# Patient Record
Sex: Female | Born: 1995 | Race: White | Hispanic: No | Marital: Single | Smoking: Never smoker
Health system: Southern US, Community
[De-identification: ages and names within clinical notes are randomized; demographics above are authoritative.]

## PROBLEM LIST (undated history)

## (undated) DIAGNOSIS — J45909 Unspecified asthma, uncomplicated: Secondary | ICD-10-CM

## (undated) DIAGNOSIS — K5900 Constipation, unspecified: Secondary | ICD-10-CM

## (undated) DIAGNOSIS — R109 Unspecified abdominal pain: Secondary | ICD-10-CM

## (undated) HISTORY — PX: CLOSED REDUCTION WRIST FRACTURE: SHX1091

## (undated) HISTORY — DX: Unspecified abdominal pain: R10.9

## (undated) HISTORY — DX: Constipation, unspecified: K59.00

---
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
## 2017-11-22 ENCOUNTER — Other Ambulatory Visit: Payer: Self-pay | Admitting: Obstetrics & Gynecology

## 2018-02-22 ENCOUNTER — Ambulatory Visit (INDEPENDENT_AMBULATORY_CARE_PROVIDER_SITE_OTHER): Payer: 59 | Admitting: Advanced Practice Midwife

## 2018-02-22 ENCOUNTER — Encounter: Payer: Self-pay | Admitting: Advanced Practice Midwife

## 2018-02-22 ENCOUNTER — Other Ambulatory Visit: Payer: Self-pay

## 2018-02-22 VITALS — BP 114/76 | HR 68 | Ht 61.0 in | Wt 104.0 lb

## 2018-02-22 DIAGNOSIS — B373 Candidiasis of vulva and vagina: Secondary | ICD-10-CM | POA: Diagnosis not present

## 2018-02-22 DIAGNOSIS — D242 Benign neoplasm of left breast: Secondary | ICD-10-CM

## 2018-02-22 DIAGNOSIS — B3731 Acute candidiasis of vulva and vagina: Secondary | ICD-10-CM

## 2018-02-22 MED ORDER — FLUCONAZOLE 150 MG PO TABS: ORAL_TABLET | ORAL | 2 refills | Status: DC

## 2018-02-22 NOTE — Progress Notes (Signed)
[facility] Visit  Patient name: Christine Solomon MRN [id number]  Date of birth: 03-19-1996  CC & HPI:  Christine Solomon is a 22 y.o.  female presenting today for vaginal itch/pian w/ sex for a few days and left breast mass for a few months  Tender only when touched. 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 Abdominal pain   . Asthma   . Constipation    Past Surgical History:  Procedure Laterality Date  . BREAST BIOPSY Left 06/30/2016   Procedure: BREAST BIOPSY;  Surgeon: Christine Signs, MD;  Location: [facility];  Service: General;  Laterality: Left;  . CLOSED REDUCTION WRIST FRACTURE Left    Family History  Problem Relation Age of Onset  . Cholelithiasis Mother   . Irritable bowel syndrome Father   . Breast cancer Paternal Grandmother   . Cancer Maternal Grandmother        skin  . Hirschsprung's disease Neg Hx   . Celiac disease Neg Hx     Current Outpatient Medications:  .  cetirizine (ZYRTEC) 10 MG tablet, Take 10 mg by mouth daily., Disp: , Rfl:  .  ibuprofen (ADVIL,MOTRIN) 200 MG tablet, Take 400 mg by mouth every 8 (eight) hours as needed (for pain.). , Disp: , Rfl:  .  montelukast (SINGULAIR) 10 MG tablet, Take 10 mg by mouth every evening. , Disp: , Rfl:  .  polyethylene glycol (MIRALAX / GLYCOLAX) packet, Take 17 g by mouth daily as needed (for constipation). , Disp: , Rfl:  .  SPRINTEC 28 0.25-35 MG-MCG tablet, TAKE 1 TABLET BY MOUTH EVERY DAY, Disp: 84 tablet, Rfl: 4 .  fluconazole (DIFLUCAN) 150 MG tablet, 1 po stat; repeat in 3 days, Disp: 2 tablet, Rfl: 2 Social History: Reviewed -  reports that she has never smoked. She has never used smokeless tobacco.  Review of Systems:   Constitutional: Negative for fever and chills Eyes: Negative for visual disturbances Respiratory: Negative for shortness of breath, dyspnea Cardiovascular: Negative  for chest pain or palpitations  Gastrointestinal: Negative for vomiting, diarrhea and constipation; no abdominal pain Genitourinary: Negative for dysuria and urgency, vaginal irritation or itching Musculoskeletal: Negative for back pain, joint pain, myalgias  Neurological: Negative for dizziness and headaches    Objective Findings:    Physical Examination: Vitals:   02/22/18 1441  BP: 114/76  Pulse: 68   General appearance - well appearing, and in no distress Mental status - alert, oriented to person, place, and time Chest:  Normal respiratory effort Breast:  2cm mobile, sl tender nodule. No erythema Heart - normal rate and regular rhythm Pelvic: red vulva/labia minora/majora w/skin breakdown in creases.  Vagina red w/yeast like DC.  Wet prep neg clue, trich or WBC, + yeast Musculoskeletal:  Normal range of motion without pain Extremities:  No edema    No results found for this or any previous visit (from the past 24 hour(s)).    Assessment & Plan:  A:   ? Fibroadenoma  yeast P:  Breast US 8/20 at 3:30 at [facility]  rx diflucan   Return for If you have any problems.  Christine Solomon CNM 02/22/2018 3:22 PM    '

## 2018-02-22 NOTE — Patient Instructions (Signed)
8/20 at 3:50 (arrive at 3:30) for breast ultrasound at [facility]

## 2018-03-07 ENCOUNTER — Other Ambulatory Visit (HOSPITAL_COMMUNITY): Payer: Self-pay

## 2019-09-05 ENCOUNTER — Encounter: Payer: Self-pay | Admitting: Advanced Practice Midwife

## 2019-09-05 ENCOUNTER — Other Ambulatory Visit: Payer: Self-pay

## 2019-09-05 ENCOUNTER — Ambulatory Visit (INDEPENDENT_AMBULATORY_CARE_PROVIDER_SITE_OTHER): Payer: 59 | Admitting: Advanced Practice Midwife

## 2019-09-05 ENCOUNTER — Other Ambulatory Visit (HOSPITAL_COMMUNITY)
Admission: RE | Admit: 2019-09-05 | Discharge: 2019-09-05 | Disposition: A | Discharge status: Home / Self Care | Payer: 59 | Source: Ambulatory Visit | Attending: Advanced Practice Midwife | Admitting: Advanced Practice Midwife

## 2019-09-05 VITALS — BP 109/68 | HR 72 | Ht 61.0 in | Wt 100.5 lb

## 2019-09-05 DIAGNOSIS — Z01419 Encounter for gynecological examination (general) (routine) without abnormal findings: Secondary | ICD-10-CM | POA: Insufficient documentation

## 2019-09-05 MED ORDER — ETONOGESTREL-ETHINYL ESTRADIOL 0.12-0.015 MG/24HR VA RING: VAGINAL_RING | VAGINAL | 4 refills | Status: AC

## 2019-09-05 NOTE — Patient Instructions (Signed)
Ethinyl Estradiol; Etonogestrel vaginal ring What is this medicine? ETHINYL ESTRADIOL; ETONOGESTREL (ETH in il es tra DYE ole; et oh noe JES trel) vaginal ring is a flexible, vaginal ring used as a contraceptive (birth control method). This medicine combines 2 types of female hormones, an estrogen and a progestin. This ring is used to prevent ovulation and pregnancy. Each ring is effective for 1 month. This medicine may be used for other purposes; ask your health care provider or pharmacist if you have questions. COMMON BRAND NAME(S): EluRyng, NuvaRing What should I tell my health care provider before I take this medicine? They need to know if you have any of these conditions:  abnormal vaginal bleeding  blood vessel disease or blood clots  breast, cervical, endometrial, ovarian, liver, or uterine cancer  diabetes  gallbladder disease  having surgery  heart disease or recent heart attack  high blood pressure  high cholesterol or triglycerides  history of irregular heartbeat or heart valve problems  kidney disease  liver disease  migraine headaches  protein C deficiency  protein S deficiency  recently had a baby, miscarriage, or abortion  stroke  systemic lupus erythematosus (SLE)  tobacco smoker  your age is more than 22 years old  an unusual or allergic reaction to estrogens, progestins, other medicines, foods, dyes, or preservatives  pregnant or trying to get pregnant  breast-feeding How should I use this medicine? Insert the ring into your vagina as directed. Follow the directions on the prescription label. The ring will remain place for 3 weeks and is then removed for a 1-week break. A new ring is inserted 1 week after the last ring was removed, on the same day of the week. Check often to make sure the ring is still in place. If the ring was out of the vagina for an unknown amount of time, you may not be protected from pregnancy. Perform a pregnancy test and  call your doctor. Do not use more often than directed. A patient package insert for the product will be given with each prescription and refill. Read this sheet carefully each time. The sheet may change frequently. Contact your pediatrician regarding the use of this medicine in children. Special care may be needed. Overdosage: If you think you have taken too much of this medicine contact a poison control center or emergency room at once. NOTE: This medicine is only for you. Do not share this medicine with others. What if I miss a dose? You will need to use the ring exactly as directed. It is very important to follow the schedule every cycle. If you do not use the ring as directed, you may not be protected from pregnancy. If the ring should slip out, is lost, or if you leave it in longer or shorter than you should, contact your health care professional for advice. What may interact with this medicine? Do not take this medicine with the following medications:  dasabuvir; ombitasvir; paritaprevir; ritonavir  ombitasvir; paritaprevir; ritonavir  vaginal lubricants or other vaginal products that are oil-based or silicone-based This medicine may also interact with the following medications:  acetaminophen  antibiotics or medicines for infections, especially rifampin, rifabutin, rifapentine, and griseofulvin, and possibly penicillins or tetracyclines  aprepitant or fosaprepitant  armodafinil  ascorbic acid (vitamin C)  barbiturate medicines, such as phenobarbital or primidone  bosentan  certain antiviral medicines for hepatitis, HIV or AIDS  certain medicines for cancer treatment  certain medicines for seizures like carbamazepine, clobazam, felbamate, lamotrigine, oxcarbazepine, phenytoin,   rufinamide, topiramate  certain medicines for treating high cholesterol  cyclosporine  dantrolene  elagolix  flibanserin  grapefruit juice  lesinurad  medicines for diabetes  medicines  to treat fungal infections, such as griseofulvin, miconazole, fluconazole, ketoconazole, itraconazole, posaconazole or voriconazole  mifepristone  mitotane  modafinil  morphine  mycophenolate  St. John's wort  tamoxifen  temazepam  theophylline or aminophylline  thyroid hormones  tizanidine  tranexamic acid  ulipristal  warfarin This list may not describe all possible interactions. Give your health care provider a list of all the medicines, herbs, non-prescription drugs, or dietary supplements you use. Also tell them if you smoke, drink alcohol, or use illegal drugs. Some items may interact with your medicine. What should I watch for while using this medicine? Visit your doctor or health care professional for regular checks on your progress. You will need a regular breast and pelvic exam and Pap smear while on this medicine. Check with your doctor or health care professional to see if you need an additional method of contraception during the first cycle that you use this ring. Female condoms (made with natural rubber latex, polyisoprene, and polyurethane) and spermicides may be used. Do not use a diaphragm, cervical cap, or a female condom, as the ring can interfere with these birth control methods and their proper placement. If you have any reason to think you are pregnant, stop using this medicine right away and contact your doctor or health care professional. If you are using this medicine for hormone related problems, it may take several cycles of use to see improvement in your condition. Smoking increases the risk of getting a blood clot or having a stroke while you are using hormonal birth control, especially if you are more than 22 years old. You are strongly advised not to smoke. Some women are prone to getting dark patches on the skin of the face (cholasma). Your risk of getting chloasma with this medicine is higher if you had chloasma during a pregnancy. Keep out of the  sun. If you cannot avoid being in the sun, wear protective clothing and use sunscreen. Do not use sun lamps or tanning beds/booths. This medicine can make your body retain fluid, making your fingers, hands, or ankles swell. Your blood pressure can go up. Contact your doctor or health care professional if you feel you are retaining fluid. If you are going to have elective surgery, you may need to stop using this medicine before the surgery. Consult your health care professional for advice. This medicine does not protect you against HIV infection (AIDS) or any other sexually transmitted diseases. What side effects may I notice from receiving this medicine? Side effects that you should report to your doctor or health care professional as soon as possible:  allergic reactions such as skin rash or itching, hives, swelling of the lips, mouth, tongue, or throat  depression  high blood pressure  migraines or severe, sudden headaches  signs and symptoms of a blood clot such as breathing problems; changes in vision; chest pain; severe, sudden headache; pain, swelling, warmth in the leg; trouble speaking; sudden numbness or weakness of the face, arm or leg  signs and symptoms of infection like fever or chills with dizziness and a sunburn-like rash, or pain or trouble passing urine  stomach pain  symptoms of vaginal infection like itching, irritation or unusual discharge  yellowing of the eyes or skin Side effects that usually do not require medical attention (report these to your doctor   or health care professional if they continue or are bothersome):  acne  breast pain, tenderness  irregular vaginal bleeding or spotting, particularly during the first month of use  mild headache  nausea  painful periods  vomiting This list may not describe all possible side effects. Call your doctor for medical advice about side effects. You may report side effects to FDA at 1-800-FDA-1088. Where should I  keep my medicine? Keep out of the reach of children. Store unopened medicine for up to 4 months at room temperature at 15 and 30 degrees C (59 and 86 degrees F). Protect from light. Do not store above 30 degrees C (86 degrees F). Throw away any unused medicine 4 months after the dispense date or the expiration date, whichever comes first. A ring may only be used for 1 cycle (1 month). After the 3-week cycle, a used ring is removed and should be placed in the re-closable foil pouch and discarded in the trash out of reach of children and pets. Do NOT flush down the toilet. NOTE: This sheet is a summary. It may not cover all possible information. If you have questions about this medicine, talk to your doctor, pharmacist, or health care provider.  2020 Elsevier/Gold Standard (2019-01-25 12:31:47)  

## 2019-09-05 NOTE — Progress Notes (Signed)
   WELL-WOMAN EXAMINATION Patient name: Christine Solomon MRN [id number]  Date of birth: March 18, 1996 Chief Complaint:   Gynecologic Exam  History of Present Illness:   Christine Solomon is a 24 y.o. G0P0000  female being seen today for a routine well-woman exam.  Current complaints: would like to start contraception- prefers NuvaRing; has used OCPs in the past with nothing recently; has irreg cycles due to low body weight, approx 1 cycle q 2-3 mos  PCP: none      does not desire labs Patient's last menstrual period was 08/31/2019 (exact date). The current method of family planning is NuvaRing vaginal inserts- wanting to start Last pap never. .  Last mammogram: has had breast u/s and lumpectomy due to fibroademoas- benign Last colonoscopy: never.  Review of Systems:   Pertinent items are noted in HPI Denies any headaches, blurred vision, fatigue, shortness of breath, chest pain, abdominal pain, abnormal vaginal discharge/itching/odor/irritation, problems with periods, bowel movements, urination, or intercourse unless otherwise stated above. Pertinent History Reviewed:  Reviewed past medical,surgical, social and family history.  Reviewed problem list, medications and allergies. Physical Assessment:   Vitals:   09/05/19 1106  BP: 109/68  Pulse: 72  Weight: 100 lb 8 oz (45.6 kg)  Height: 5\' 1"  (1.549 m)  Body mass index is 18.99 kg/m.        Physical Examination:   General appearance - well appearing, and in no distress  Mental status - alert, oriented to person, place, and time  Psych:  She has a normal mood and affect  Skin - warm and dry, normal color, no suspicious lesions noted  Chest - effort normal, all lung fields clear to auscultation bilaterally  Heart - normal rate and regular rhythm  Neck:  midline trachea, no thyromegaly or nodules  Breasts - breasts appear normal, no suspicious masses, no skin or nipple changes or  axillary nodes; scar upper aspect L breast from  lumpectomy  Abdomen - soft, nontender, nondistended, no masses or organomegaly  Pelvic - VULVA: normal appearing vulva with no masses, tenderness or lesions  VAGINA: normal appearing vagina with normal color and discharge, no lesions  CERVIX: normal appearing cervix without discharge or lesions, no CMT  Thin prep pap is done without HR HPV cotesting  UTERUS: uterus is felt to be normal size, shape, consistency and nontender   ADNEXA: No adnexal masses or tenderness noted.  Extremities:  No swelling or varicosities noted  Chaperone: Latisha Cresenzo    No results found for this or any previous visit (from the past 24 hour(s)).  Assessment & Plan:  1) Well-Woman Exam  2) New start Nuva Ring  Labs/procedures today: Pap  Mammogram age 19 or sooner if problems Colonoscopy age 41 or sooner if problems  No orders of the defined types were placed in this encounter.   Meds:  Meds ordered this encounter  Medications  . etonogestrel-ethinyl estradiol (NUVARING) 0.12-0.015 MG/24HR vaginal ring    Sig: Insert vaginally and leave in place for 3 consecutive weeks, then remove for 1 week.    Dispense:  1 each    Refill:  4    Order Specific Question:   Supervising Provider    Answer:   Jonnie Kind [[id number]]    Follow-up: Return in about 3 months (around 12/03/2019) for f/u contraception.  Myrtis Ser CNM 09/05/2019 11:43 AM

## 2019-09-06 LAB — CYTOLOGY - PAP
Chlamydia: NEGATIVE
Comment: NEGATIVE
Comment: NORMAL
Diagnosis: NEGATIVE
Neisseria Gonorrhea: NEGATIVE

## 2019-12-03 ENCOUNTER — Ambulatory Visit: Payer: 59 | Admitting: Women's Health
